# Patient Record
Sex: Male | Born: 1990 | Race: Black or African American | Hispanic: No | Marital: Single | Smoking: Current every day smoker
Health system: Southern US, Community
[De-identification: ages and names within clinical notes are randomized; demographics above are authoritative.]

---

## 2013-07-17 ENCOUNTER — Ambulatory Visit
Admission: RE | Admit: 2013-07-17 | Discharge: 2013-07-17 | Disposition: A | Discharge status: Home / Self Care | Payer: No Typology Code available for payment source | Source: Ambulatory Visit | Attending: General Practice | Admitting: General Practice

## 2013-07-17 ENCOUNTER — Other Ambulatory Visit: Payer: Self-pay | Admitting: General Practice

## 2013-07-17 DIAGNOSIS — Z9289 Personal history of other medical treatment: Secondary | ICD-10-CM

## 2015-03-07 IMAGING — CR DG CHEST 2V
2 series · 2 of 2 positions shown · non-contrast
Comparison: None.

CLINICAL DATA: History positive PPD test, asymptomatic.

EXAM:
CHEST  2 VIEW

[w chest pa]
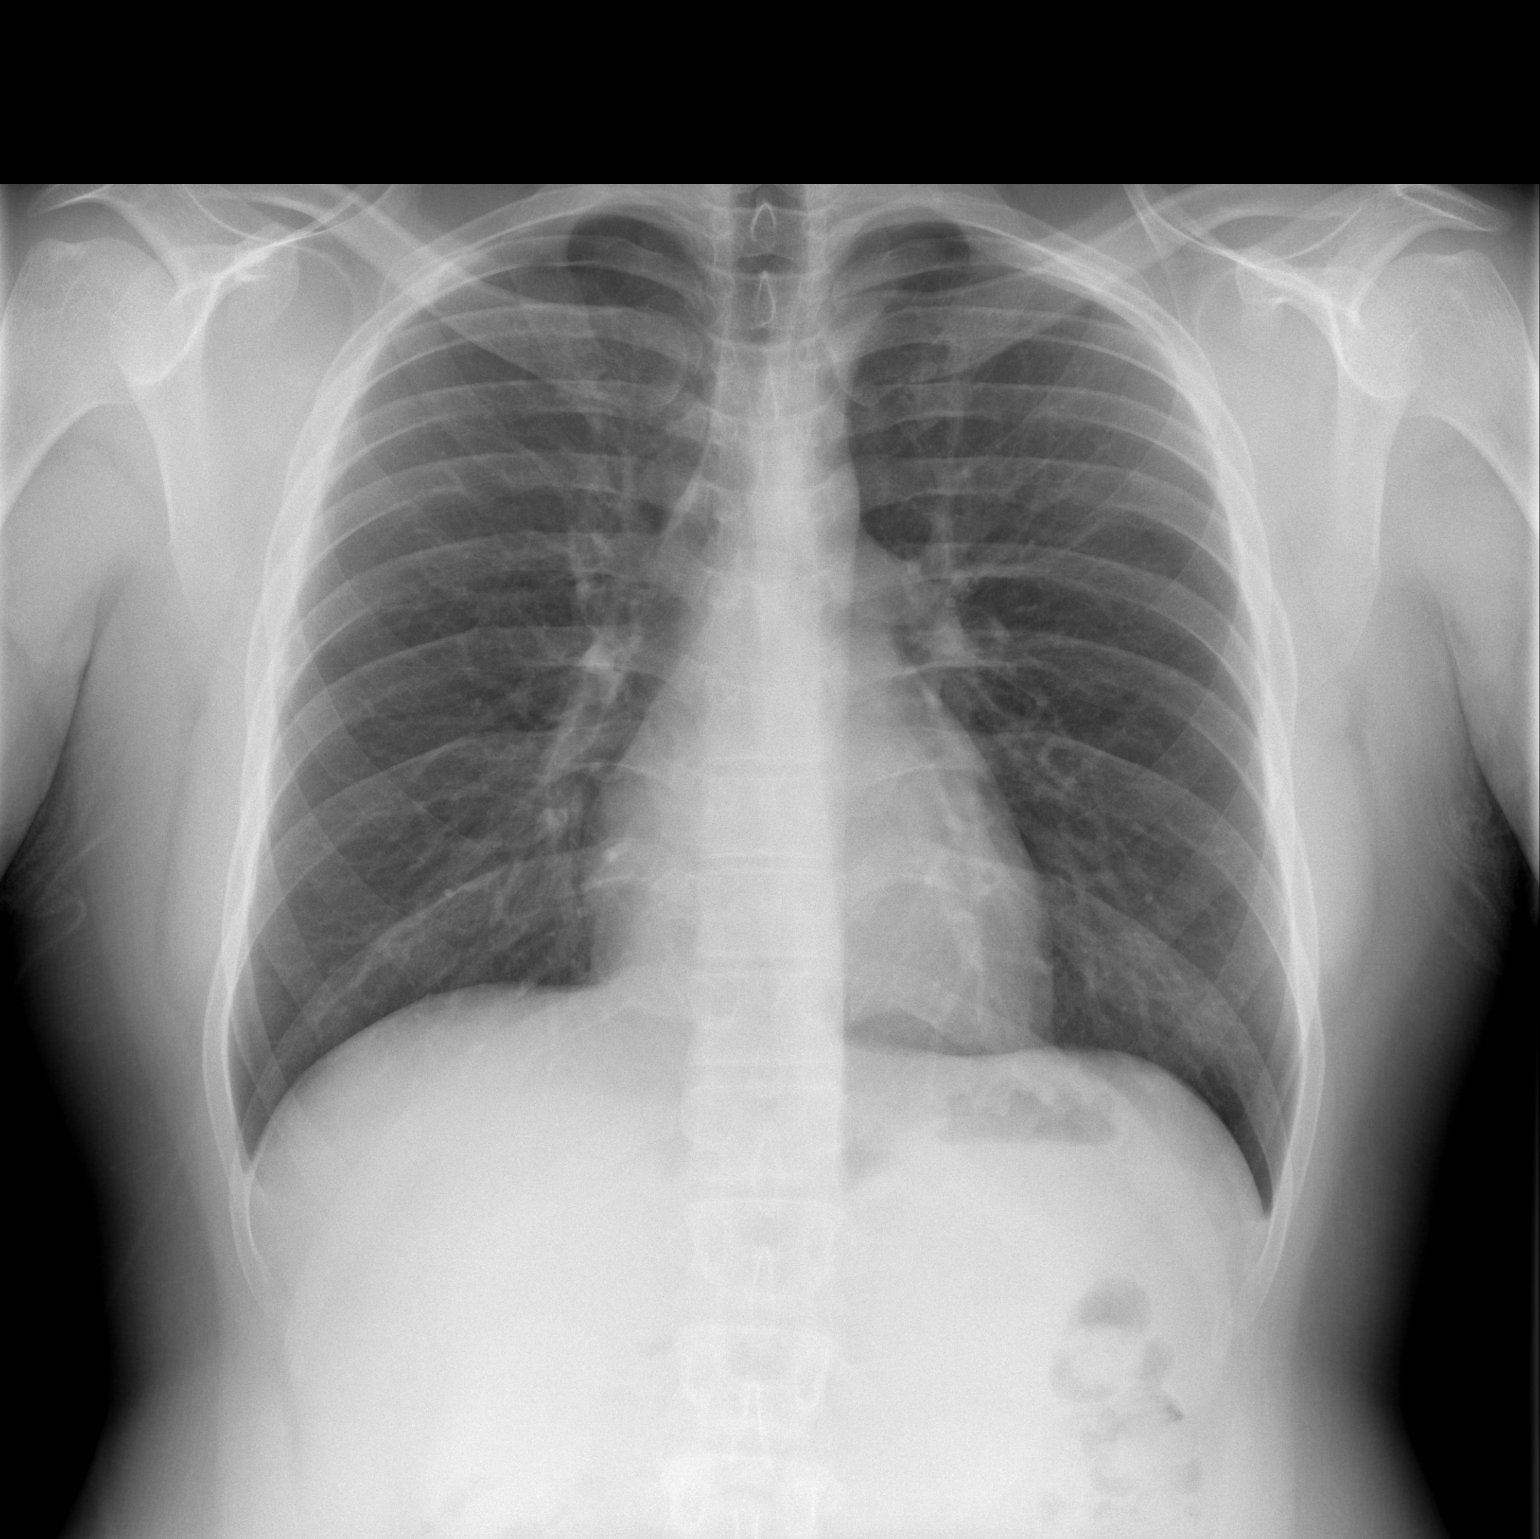

[w chest lat]
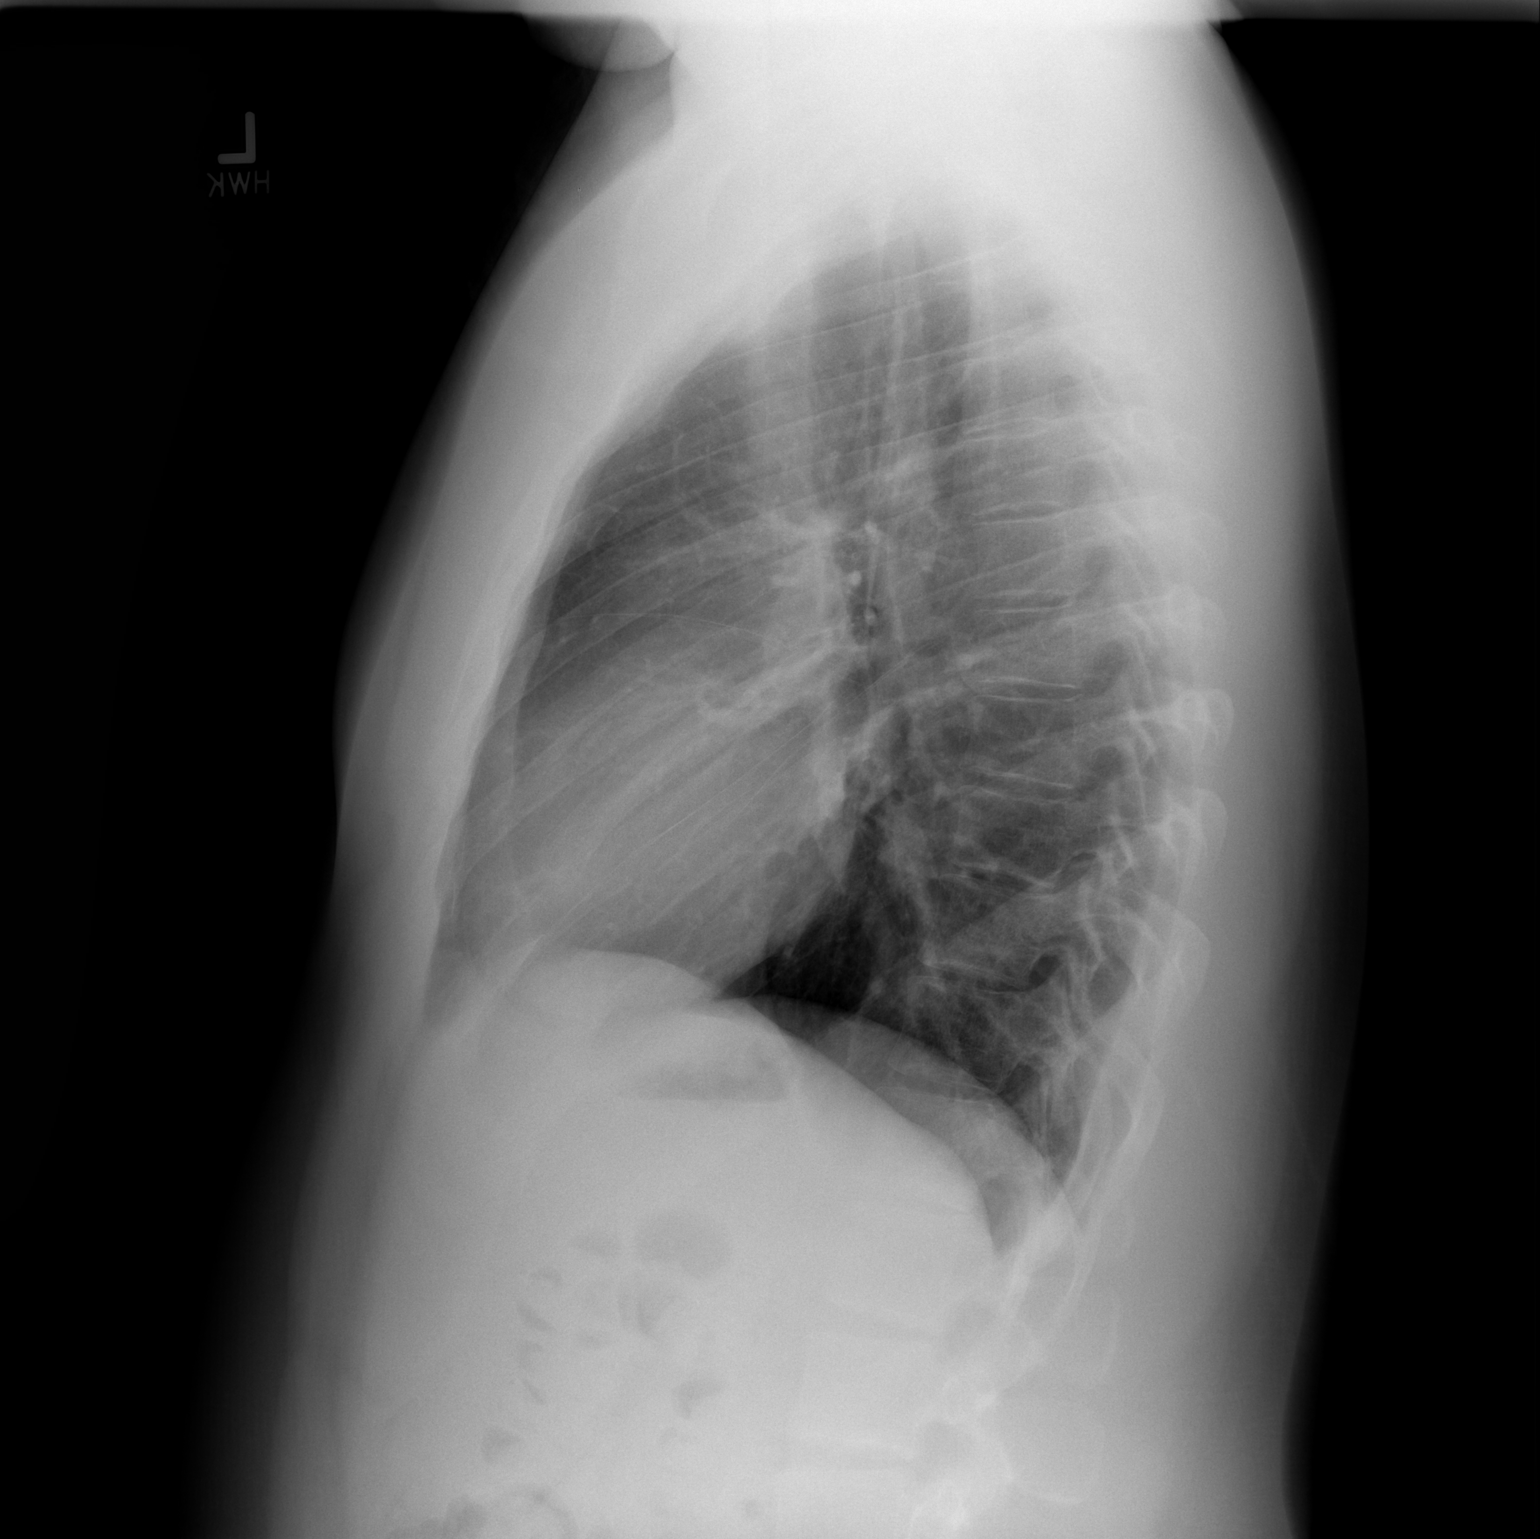

[2 of 2 positions shown; findings below may reference images not displayed]

FINDINGS: Trachea is midline. Heart size normal. Lungs are clear. No pleural
fluid.
IMPRESSION: No acute findings.  No evidence of active tuberculosis.

## 2023-09-06 ENCOUNTER — Other Ambulatory Visit: Payer: Self-pay

## 2023-09-06 ENCOUNTER — Emergency Department: Payer: Medicaid Other

## 2023-09-06 ENCOUNTER — Emergency Department
Admission: EM | Admit: 2023-09-06 | Discharge: 2023-09-06 | Disposition: A | Discharge status: Home / Self Care | Payer: Medicaid Other | Attending: Emergency Medicine | Admitting: Emergency Medicine

## 2023-09-06 ENCOUNTER — Encounter: Payer: Self-pay | Admitting: Emergency Medicine

## 2023-09-06 DIAGNOSIS — M79604 Pain in right leg: Secondary | ICD-10-CM | POA: Diagnosis not present

## 2023-09-06 DIAGNOSIS — Y92410 Unspecified street and highway as the place of occurrence of the external cause: Secondary | ICD-10-CM | POA: Diagnosis not present

## 2023-09-06 DIAGNOSIS — J9811 Atelectasis: Secondary | ICD-10-CM | POA: Insufficient documentation

## 2023-09-06 DIAGNOSIS — D649 Anemia, unspecified: Secondary | ICD-10-CM | POA: Diagnosis not present

## 2023-09-06 DIAGNOSIS — R519 Headache, unspecified: Secondary | ICD-10-CM | POA: Diagnosis not present

## 2023-09-06 DIAGNOSIS — S60512A Abrasion of left hand, initial encounter: Secondary | ICD-10-CM | POA: Insufficient documentation

## 2023-09-06 DIAGNOSIS — S6992XA Unspecified injury of left wrist, hand and finger(s), initial encounter: Secondary | ICD-10-CM | POA: Diagnosis present

## 2023-09-06 DIAGNOSIS — M25511 Pain in right shoulder: Secondary | ICD-10-CM | POA: Insufficient documentation

## 2023-09-06 DIAGNOSIS — Z23 Encounter for immunization: Secondary | ICD-10-CM | POA: Insufficient documentation

## 2023-09-06 LAB — COMPREHENSIVE METABOLIC PANEL
ALT: 15 U/L (ref 0–44)
AST: 19 U/L (ref 15–41)
Albumin: 3.8 g/dL (ref 3.5–5.0)
Alkaline Phosphatase: 47 U/L (ref 38–126)
Anion gap: 7 (ref 5–15)
BUN: 12 mg/dL (ref 6–20)
CO2: 24 mmol/L (ref 22–32)
Calcium: 8.7 mg/dL — ABNORMAL LOW (ref 8.9–10.3)
Chloride: 106 mmol/L (ref 98–111)
Creatinine, Ser: 0.72 mg/dL (ref 0.61–1.24)
GFR, Estimated: >60 mL/min (ref >60)
Glucose, Bld: 121 mg/dL — ABNORMAL HIGH (ref 70–99)
Potassium: 3.9 mmol/L (ref 3.5–5.1)
Sodium: 137 mmol/L (ref 135–145)
Total Bilirubin: 0.7 mg/dL (ref 0.0–1.2)
Total Protein: 7.4 g/dL (ref 6.5–8.1)

## 2023-09-06 LAB — CBC
HCT: 41.5 % (ref 39.0–52.0)
Hemoglobin: 12.9 g/dL — ABNORMAL LOW (ref 13.0–17.0)
MCH: 21.8 pg — ABNORMAL LOW (ref 26.0–34.0)
MCHC: 31.1 g/dL (ref 30.0–36.0)
MCV: 70.1 fL — ABNORMAL LOW (ref 80.0–100.0)
Platelets: 285 10*3/uL (ref 150–400)
RBC: 5.92 MIL/uL — ABNORMAL HIGH (ref 4.22–5.81)
RDW: 15.3 % (ref 11.5–15.5)
WBC: 7.6 10*3/uL (ref 4.0–10.5)
nRBC: 0 % (ref 0.0–0.2)

## 2023-09-06 MED ORDER — IOHEXOL 350 MG/ML SOLN
100.0000 mL | Freq: Once | INTRAVENOUS | Status: AC | PRN
  Administered 2023-09-06: 100 mL via INTRAVENOUS

## 2023-09-06 MED ORDER — TETANUS-DIPHTH-ACELL PERTUSSIS 5-2.5-18.5 LF-MCG/0.5 IM SUSY
0.5000 mL | PREFILLED_SYRINGE | Freq: Once | INTRAMUSCULAR | Status: AC
  Administered 2023-09-06: 0.5 mL via INTRAMUSCULAR
  Filled 2023-09-06: qty 0.5

## 2023-09-06 MED ORDER — CYCLOBENZAPRINE HCL 10 MG PO TABS: 10.0000 mg | ORAL_TABLET | Freq: Three times a day (TID) | ORAL | 0 refills | Status: AC | PRN
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
## 2023-09-17 ENCOUNTER — Emergency Department
Admission: EM | Admit: 2023-09-17 | Discharge: 2023-09-17 | Disposition: A | Discharge status: Home / Self Care | Attending: Emergency Medicine | Admitting: Emergency Medicine

## 2023-09-17 ENCOUNTER — Other Ambulatory Visit: Payer: Self-pay

## 2023-09-17 DIAGNOSIS — F0781 Postconcussional syndrome: Secondary | ICD-10-CM | POA: Diagnosis not present

## 2023-09-17 DIAGNOSIS — M25532 Pain in left wrist: Secondary | ICD-10-CM | POA: Diagnosis not present

## 2023-09-17 DIAGNOSIS — S161XXD Strain of muscle, fascia and tendon at neck level, subsequent encounter: Secondary | ICD-10-CM

## 2023-09-17 DIAGNOSIS — Y92410 Unspecified street and highway as the place of occurrence of the external cause: Secondary | ICD-10-CM | POA: Insufficient documentation

## 2023-09-17 DIAGNOSIS — S8001XD Contusion of right knee, subsequent encounter: Secondary | ICD-10-CM

## 2023-09-17 DIAGNOSIS — M542 Cervicalgia: Secondary | ICD-10-CM | POA: Diagnosis present

## 2023-09-17 MED ORDER — METOCLOPRAMIDE HCL 10 MG PO TABS: 10.0000 mg | ORAL_TABLET | Freq: Three times a day (TID) | ORAL | 0 refills | Status: AC | PRN

## 2023-09-17 MED ORDER — CYCLOBENZAPRINE HCL 10 MG PO TABS
10.0000 mg | ORAL_TABLET | Freq: Three times a day (TID) | ORAL | 0 refills | Status: AC | PRN
Start: 2023-09-17 — End: ?
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
## 2023-09-17 NOTE — ED Notes (Signed)
 Pt discharged to ED circle at this time and left with all belongings. Pt ABCs intact. RR even and unlabored. Pt in NAD. Pt denies further needs from this RN.

## 2023-11-07 ENCOUNTER — Ambulatory Visit: Admitting: Pediatrics
